# Patient Record
Sex: Male | Born: 1970 | Race: Black or African American | Hispanic: No | Marital: Single | Smoking: Current every day smoker
Health system: Southern US, Community
[De-identification: ages and names within clinical notes are randomized; demographics above are authoritative.]

## PROBLEM LIST (undated history)

## (undated) HISTORY — PX: FACIAL FRACTURE SURGERY: SHX1570

---

## 2014-04-28 ENCOUNTER — Encounter (HOSPITAL_BASED_OUTPATIENT_CLINIC_OR_DEPARTMENT_OTHER): Payer: Self-pay | Admitting: Emergency Medicine

## 2014-04-28 ENCOUNTER — Emergency Department (HOSPITAL_BASED_OUTPATIENT_CLINIC_OR_DEPARTMENT_OTHER)
Admission: EM | Admit: 2014-04-28 | Discharge: 2014-04-29 | Disposition: A | Discharge status: Home / Self Care | Payer: Self-pay | Attending: Emergency Medicine | Admitting: Emergency Medicine

## 2014-04-28 ENCOUNTER — Emergency Department (HOSPITAL_BASED_OUTPATIENT_CLINIC_OR_DEPARTMENT_OTHER): Payer: Self-pay

## 2014-04-28 DIAGNOSIS — W010XXA Fall on same level from slipping, tripping and stumbling without subsequent striking against object, initial encounter: Secondary | ICD-10-CM | POA: Insufficient documentation

## 2014-04-28 DIAGNOSIS — S99919A Unspecified injury of unspecified ankle, initial encounter: Secondary | ICD-10-CM

## 2014-04-28 DIAGNOSIS — Y92009 Unspecified place in unspecified non-institutional (private) residence as the place of occurrence of the external cause: Secondary | ICD-10-CM | POA: Insufficient documentation

## 2014-04-28 DIAGNOSIS — Y9389 Activity, other specified: Secondary | ICD-10-CM | POA: Insufficient documentation

## 2014-04-28 DIAGNOSIS — F17200 Nicotine dependence, unspecified, uncomplicated: Secondary | ICD-10-CM | POA: Insufficient documentation

## 2014-04-28 DIAGNOSIS — S99929A Unspecified injury of unspecified foot, initial encounter: Secondary | ICD-10-CM

## 2014-04-28 DIAGNOSIS — S8990XA Unspecified injury of unspecified lower leg, initial encounter: Secondary | ICD-10-CM | POA: Insufficient documentation

## 2014-04-28 DIAGNOSIS — S8001XA Contusion of right knee, initial encounter: Secondary | ICD-10-CM

## 2014-04-28 DIAGNOSIS — S8000XA Contusion of unspecified knee, initial encounter: Secondary | ICD-10-CM | POA: Insufficient documentation

## 2014-04-28 NOTE — ED Notes (Signed)
Trip/fell last night-pain to left knee

## 2014-04-28 NOTE — ED Provider Notes (Signed)
CSN: [id number]     Arrival date & time 04/28/14  2126 History   First MD Initiated Contact with Patient 04/28/14 2308     Chief Complaint  Patient presents with  . Fall     (Consider location/radiation/quality/duration/timing/severity/associated sxs/prior Treatment) Patient is a 43 y.o. male presenting with fall. The history is provided by the patient.  Fall This is a new problem. The current episode started yesterday. The problem occurs constantly. The problem has been gradually improving. Associated symptoms include joint swelling. The symptoms are aggravated by walking and standing. He has tried NSAIDs for the symptoms. The treatment provided mild relief.   Chad Petty is a 43 y.o. male who presents to the ED with right knee pain. He states he tripped and fell from his porch last night. After the fall his knee was swollen and painful. He applied ice and rested the area and took ibuprofen. Today the swelling is less but he continues to have pain.  History reviewed. No pertinent past medical history. Past Surgical History  Procedure Laterality Date  . Facial fracture surgery     No family history on file. History  Substance Use Topics  . Smoking status: Current Every Day Smoker  . Smokeless tobacco: Not on file  . Alcohol Use: Yes    Review of Systems  Musculoskeletal: Positive for joint swelling.       Right knee pain  All other systems negative.    Allergies  Review of patient's allergies indicates no known allergies.  Home Medications   Prior to Admission medications   Not on File   BP 148/93  Pulse 62  Temp(Src) 98.1 F (36.7 C) (Oral)  Resp 18  Ht  (1.753 m)  Wt 178 lb (80.74 kg)  BMI 26.27 kg/m2  SpO2 100% Physical Exam  Nursing note and vitals reviewed. Constitutional: He is oriented to person, place, and time. He appears well-developed and well-nourished. No distress.  HENT:  Head: Normocephalic.  Eyes: EOM are normal.  Neck: Neck supple.   Cardiovascular: Normal rate.   Pulmonary/Chest: Effort normal.  Abdominal: Soft. There is no tenderness.  Musculoskeletal:       Right knee: He exhibits swelling. He exhibits no deformity, no erythema, normal alignment and no LCL laxity. Decreased range of motion: due to pain. Tenderness found. Medial joint line tenderness noted.  Pedal pulses equal, adequate circulation, good touch sensation. Increased pain with flexion of the knee.   Neurological: He is alert and oriented to person, place, and time. No cranial nerve deficit.  Skin: Skin is warm and dry.  Psychiatric: He has a normal mood and affect. His behavior is normal.    ED Course  Procedures (including critical care time) Labs Review Labs Reviewed - No data to display  Imaging Review Dg Knee Complete 4 Views Right  04/28/2014   CLINICAL DATA:  Fall.  Knee injury with pain on the medial side  EXAM: RIGHT KNEE - COMPLETE 4+ VIEW  COMPARISON:  None.  FINDINGS: There is no evidence of fracture, dislocation, or definite joint effusion. No degenerative joint narrowing.  IMPRESSION: Negative.   Electronically Signed   By: Tiburcio Pea M.D.   On: 04/28/2014 22:49   MDM  43 y.o. male with pain and swelling to the right knee s/p fall yesterday. Will treat for contusion and swelling of the knee. Knee sleeve applied, ice, elevation and pain management. He will follow up with ortho if symptoms persist. I have reviewed this patient's  vital signs, nurses notes, appropriate labs and imaging.  I have discussed findings and plan of care with the patient and he voices understanding and agrees with plan. Stable for discharge and remains neurovascularly intact.    Medication List         HYDROcodone-acetaminophen 5-325 MG per tablet  Commonly known as:  NORCO  Take 1 tablet by mouth every 6 (six) hours as needed for moderate pain.           [address] [place], [place] 04/29/14 [phone]

## 2014-04-29 MED ORDER — HYDROCODONE-ACETAMINOPHEN 5-325 MG PO TABS: 1.0000 | ORAL_TABLET | Freq: Four times a day (QID) | ORAL | Status: AC | PRN

## 2014-04-29 NOTE — Discharge Instructions (Signed)
Continue to take ibuprofen as needed. Do not take the narcotic if you are driving as it will make you sleepy.   Contusion A contusion is a deep bruise. Contusions happen when an injury causes bleeding under the skin. Signs of bruising include pain, puffiness (swelling), and discolored skin. The contusion may turn blue, purple, or yellow. HOME CARE   Put ice on the injured area.  Put ice in a plastic bag.  Place a towel between your skin and the bag.  Leave the ice on for 15-20 minutes, 03-04 times a day.  Only take medicine as told by your doctor.  Rest the injured area.  If possible, raise (elevate) the injured area to lessen puffiness. GET HELP RIGHT AWAY IF:   You have more bruising or puffiness.  You have pain that is getting worse.  Your puffiness or pain is not helped by medicine. MAKE SURE YOU:   Understand these instructions.  Will watch your condition.  Will get help right away if you are not doing well or get worse. Document Released: 01/11/2008 Document Revised: 10/17/2011 Document Reviewed: 05/30/2011 [facility] Patient Information 2015 [place], [place]. This information is not intended to replace advice given to you by your health care provider. Make sure you discuss any questions you have with your health care provider.

## 2014-04-29 NOTE — ED Provider Notes (Signed)
Medical screening examination/treatment/procedure(s) were performed by non-physician practitioner and as supervising physician I was immediately available for consultation/collaboration.   EKG Interpretation None       Roderic Lammert K Mystie Ormand-Rasch, MD 04/29/14 [phone]

## 2015-09-24 IMAGING — CR DG KNEE COMPLETE 4+V*R*
4 series · 4 of 4 positions shown · non-contrast
Comparison: None.

CLINICAL DATA: Fall.  Knee injury with pain on the medial side

EXAM:
RIGHT KNEE - COMPLETE 4+ VIEW

[t knee ap right]
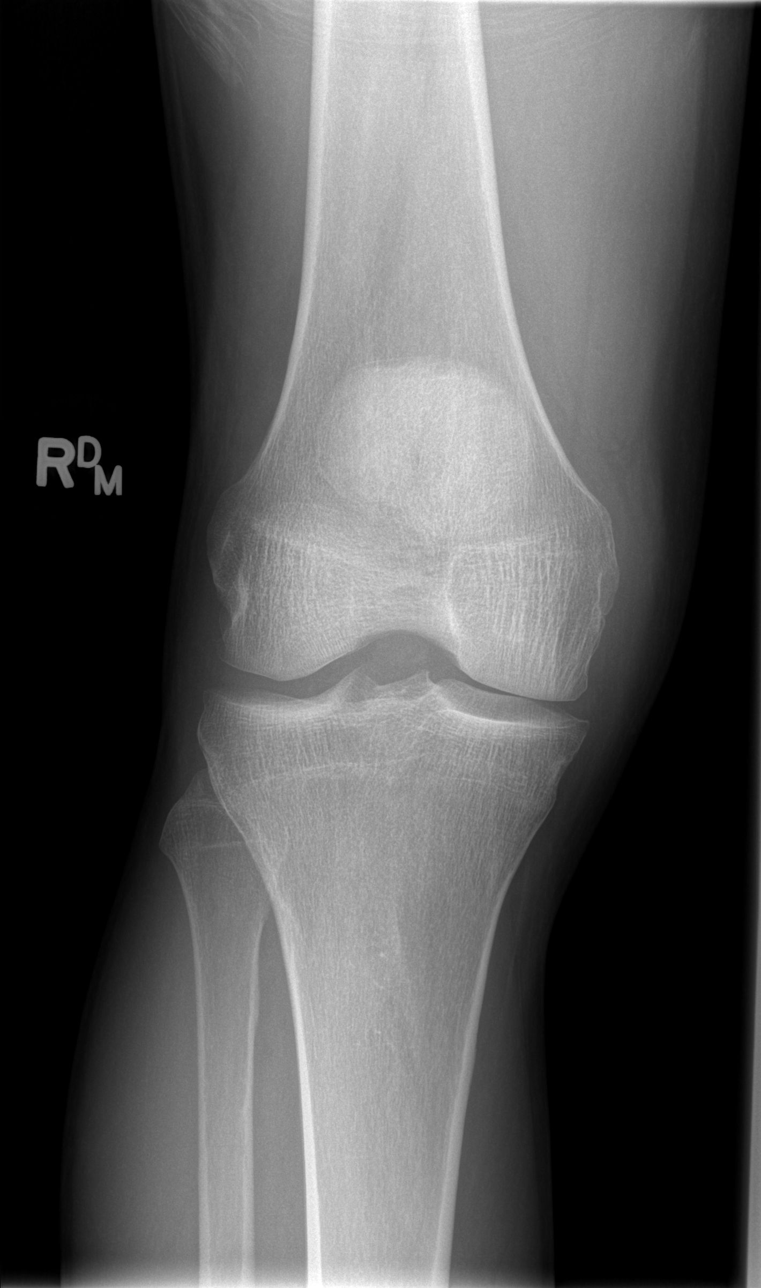

[t knee oblique right (1 of 2)]
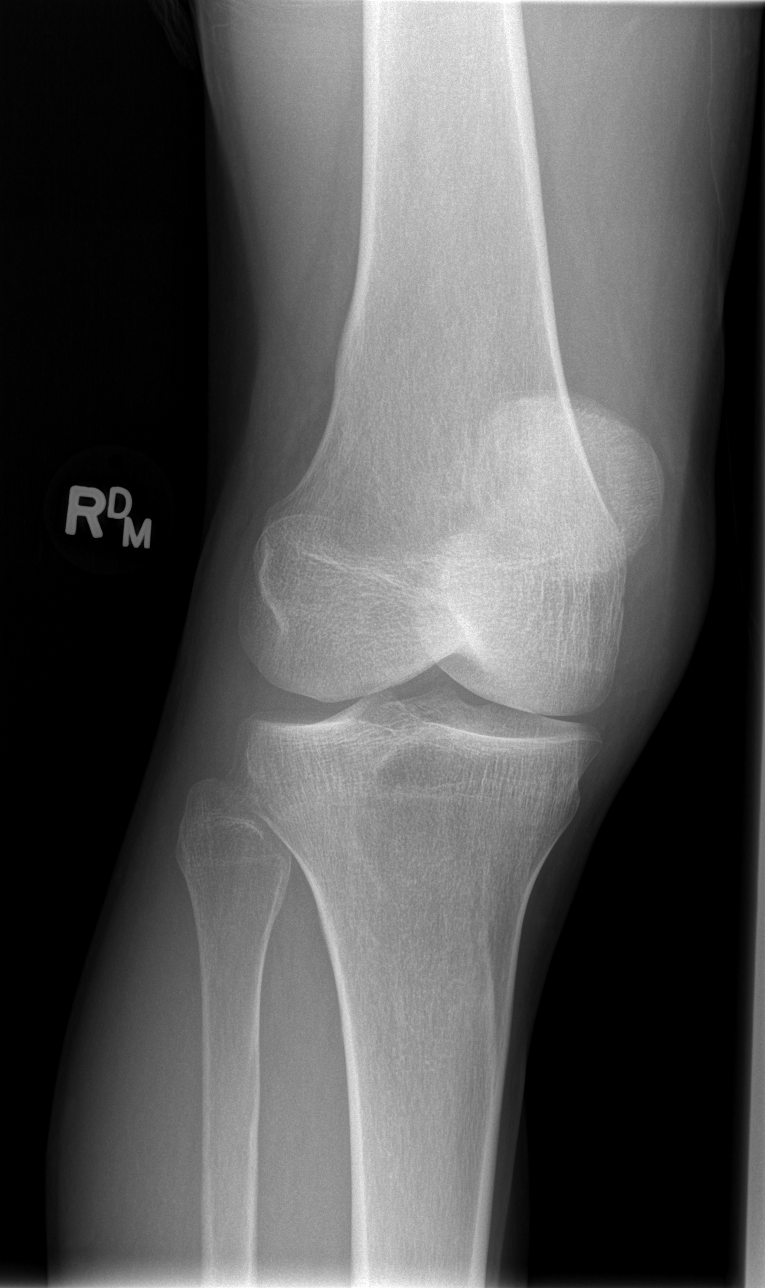

[t knee oblique right (2 of 2)]
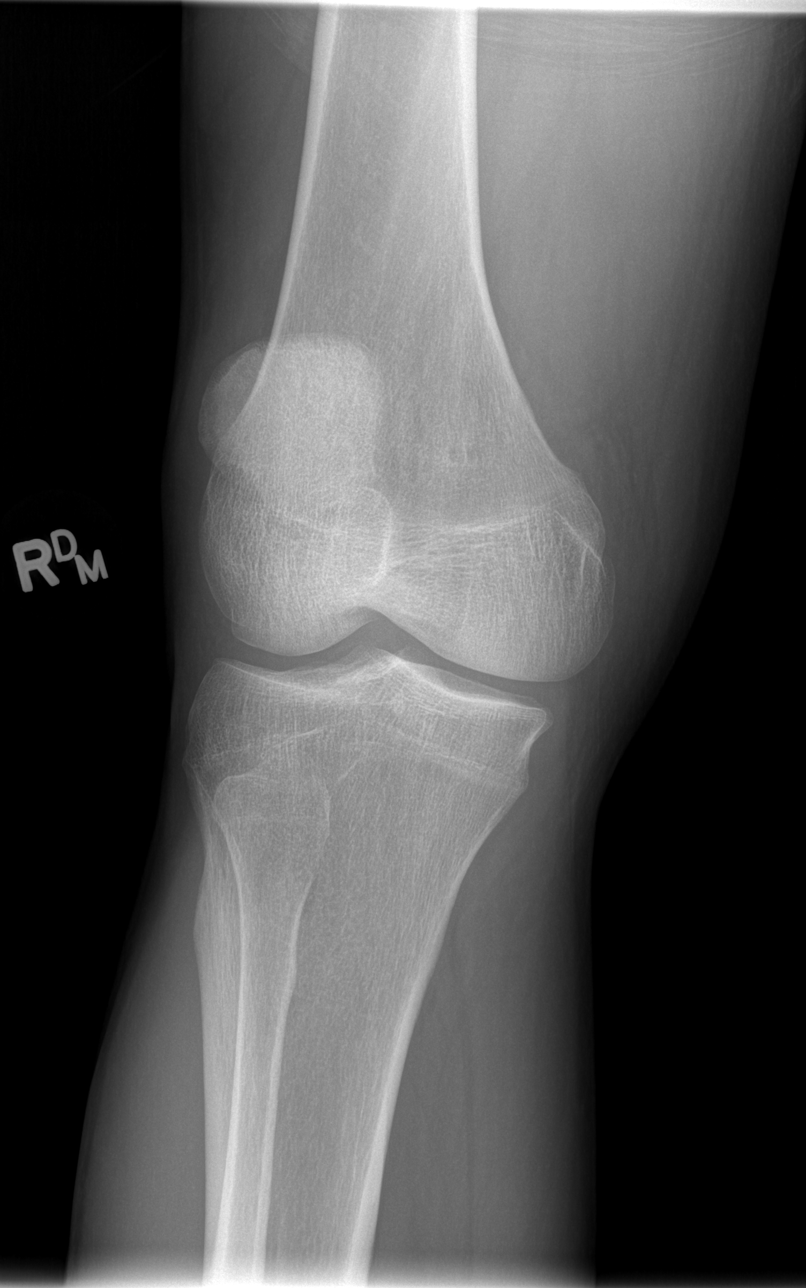

[t knee lat right]
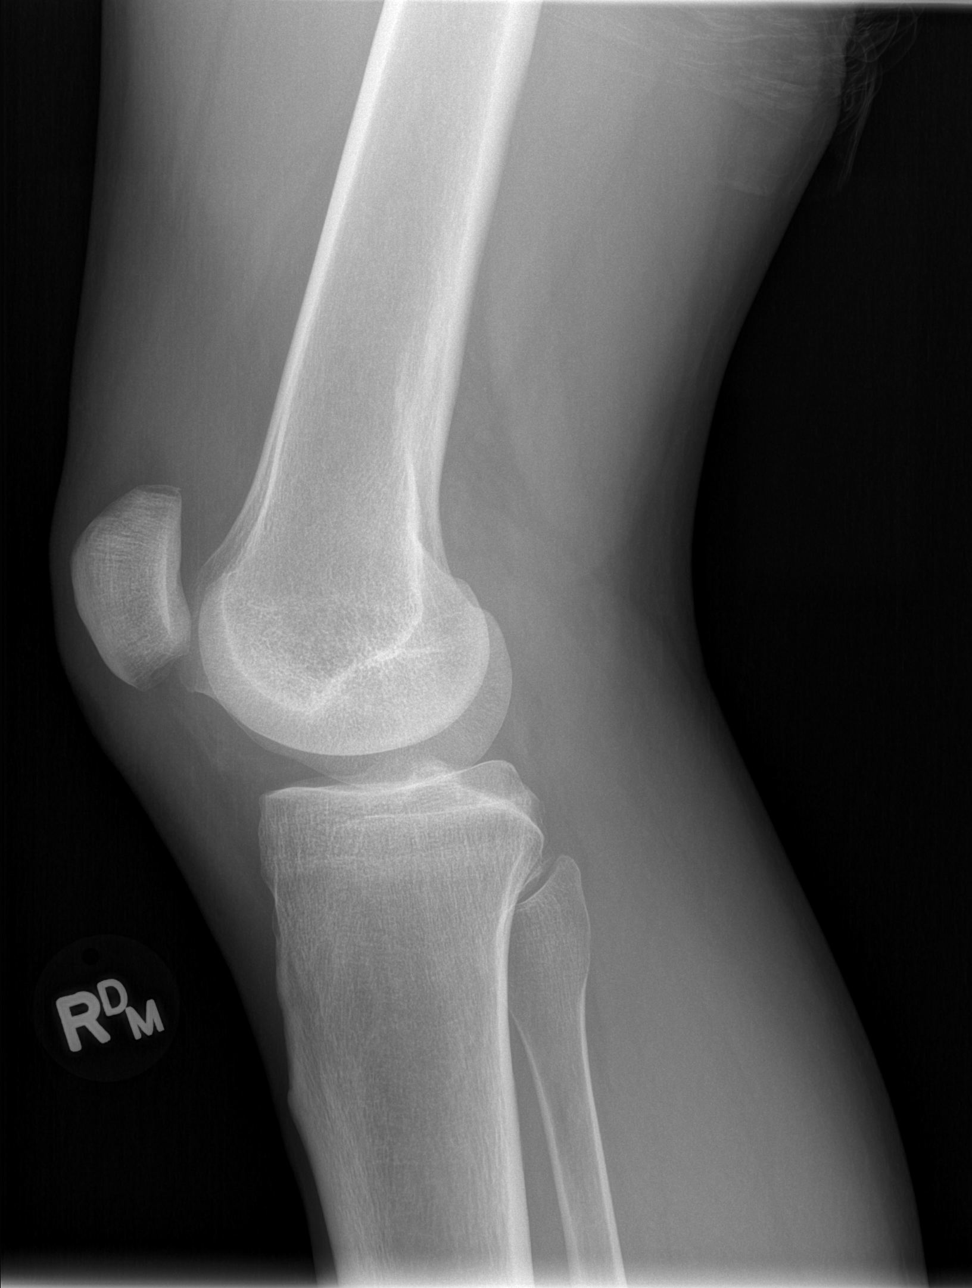

[4 of 4 positions shown; findings below may reference images not displayed]

FINDINGS: There is no evidence of fracture, dislocation, or definite joint
effusion. No degenerative joint narrowing.
IMPRESSION: Negative.
